# Patient Record
Sex: Female | Born: 1960 | Race: White | Hispanic: No | Marital: Married | Smoking: Never smoker
Health system: Southern US, Community
[De-identification: ages and names within clinical notes are randomized; demographics above are authoritative.]

## PROBLEM LIST (undated history)

## (undated) DIAGNOSIS — J189 Pneumonia, unspecified organism: Secondary | ICD-10-CM

## (undated) DIAGNOSIS — D649 Anemia, unspecified: Secondary | ICD-10-CM

## (undated) DIAGNOSIS — N840 Polyp of corpus uteri: Secondary | ICD-10-CM

## (undated) DIAGNOSIS — K219 Gastro-esophageal reflux disease without esophagitis: Secondary | ICD-10-CM

## (undated) HISTORY — PX: COLONOSCOPY: SHX174

## (undated) HISTORY — PX: UPPER GI ENDOSCOPY: SHX6162

---

## 1998-08-21 ENCOUNTER — Other Ambulatory Visit: Admission: RE | Admit: 1998-08-21 | Discharge: 1998-08-21 | Payer: Self-pay | Admitting: *Deleted

## 1999-09-02 ENCOUNTER — Other Ambulatory Visit: Admission: RE | Admit: 1999-09-02 | Discharge: 1999-09-02 | Payer: Self-pay | Admitting: *Deleted

## 2000-09-02 ENCOUNTER — Other Ambulatory Visit: Admission: RE | Admit: 2000-09-02 | Discharge: 2000-09-02 | Payer: Self-pay | Admitting: *Deleted

## 2001-09-07 ENCOUNTER — Other Ambulatory Visit: Admission: RE | Admit: 2001-09-07 | Discharge: 2001-09-07 | Payer: Self-pay | Admitting: *Deleted

## 2004-02-11 ENCOUNTER — Other Ambulatory Visit: Admission: RE | Admit: 2004-02-11 | Discharge: 2004-02-11 | Payer: Self-pay | Admitting: Obstetrics and Gynecology

## 2005-02-19 ENCOUNTER — Ambulatory Visit: Payer: Self-pay | Admitting: Internal Medicine

## 2005-12-17 ENCOUNTER — Ambulatory Visit: Payer: Self-pay | Admitting: Gastroenterology

## 2005-12-21 ENCOUNTER — Ambulatory Visit: Payer: Self-pay | Admitting: Gastroenterology

## 2006-05-01 IMAGING — NM NUCLEAR MEDICINE HEPATOHBILIARY INCLUDE GB
2 series · 12 of 12 positions shown · non-contrast
Comparison: none

REASON FOR EXAM: RUQ Pain
COMMENTS:

[Series 1: gallbladder dynamic · 4.80mm/px · 6 of 60 frames shown]
[frame 6/60]
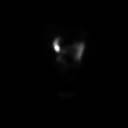
[frame 16/60]
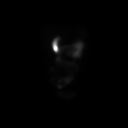
[frame 26/60]
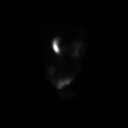
[frame 36/60]
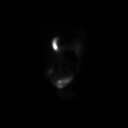
[frame 46/60]
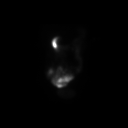
[frame 56/60]
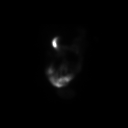

[Series 1: gallbladder dynamic (results) · 4.80mm/px · 6 of 60 frames shown]
[frame 6/60]
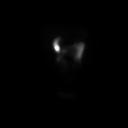
[frame 16/60]
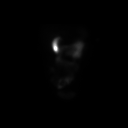
[frame 26/60]
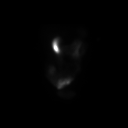
[frame 36/60]
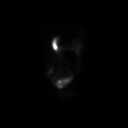
[frame 46/60]
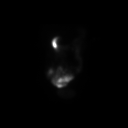
[frame 56/60]
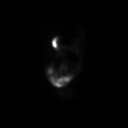

[12 of 12 positions shown; findings below may reference images not displayed]

PROCEDURE:     NM  - NM HEPATO WITH GB EJECT FRACTION  - February 19, 2005 [DATE]

RESULT:     The patient received 8.4 mCi of Technetium 99m labeled Choletec.
 The patient also received 1.2 micrograms of Sincalide intravenously over 30
minutes.  There is adequate uptake of the radiopharmaceutical by the liver.
Activity is demonstrated within the common bile duct and gallbladder by 20
minutes.  The 30 minute gallbladder ejection fraction is normal at 53%.
IMPRESSION: Normal gallbladder ejection fraction.

## 2007-02-26 IMAGING — RF DG BARIUM SWALLOW
1 series · 13 of 13 positions shown · non-contrast
Comparison: none

REASON FOR EXAM: With tablet.  Dysphagia
COMMENTS:

[Series 1: run · 11 acquisitions, 13 frames shown]
[im 1/11]
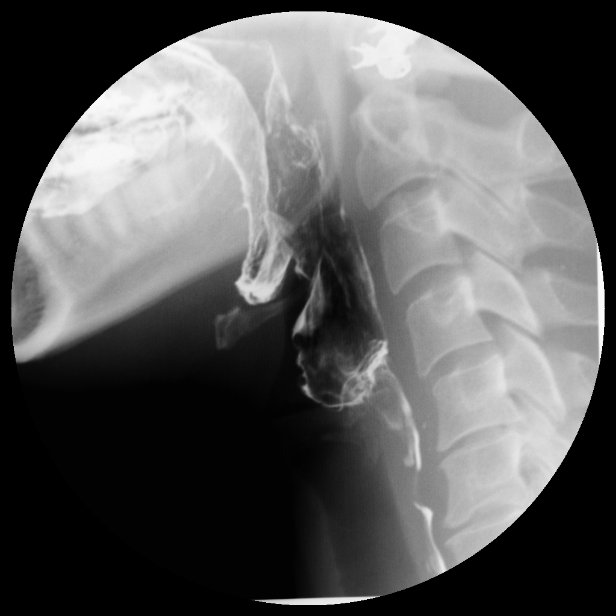
[im 1/11]
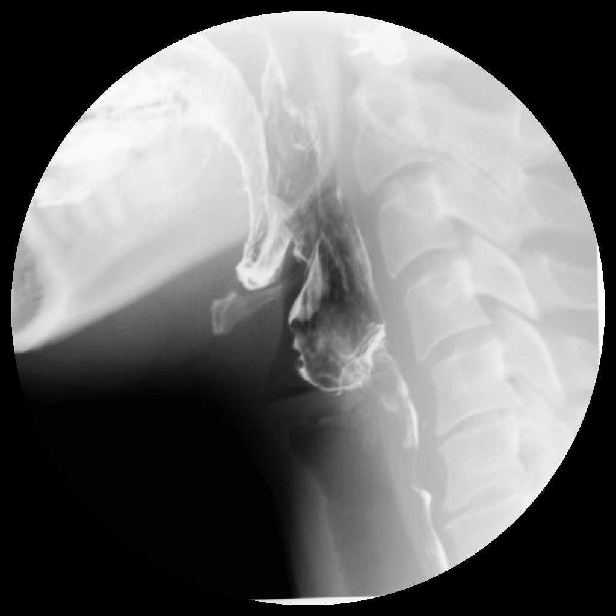
[im 2/11]
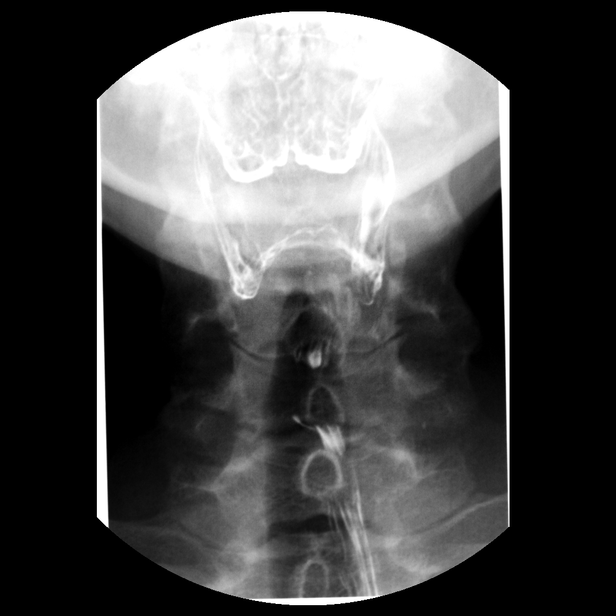
[im 2/11]
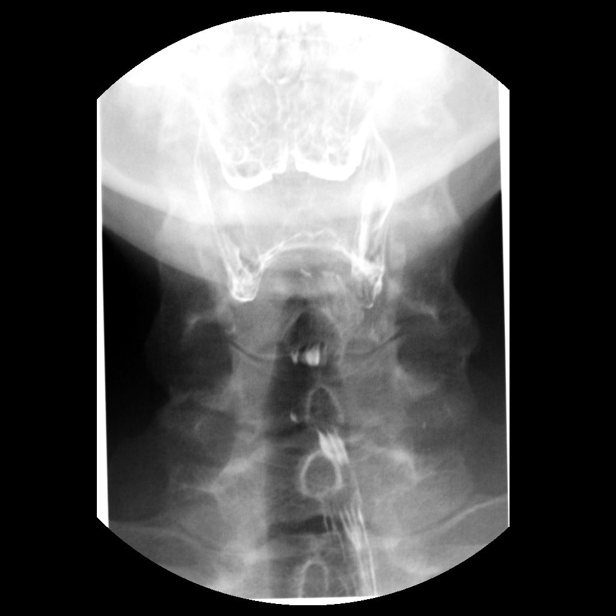
[im 3/11]
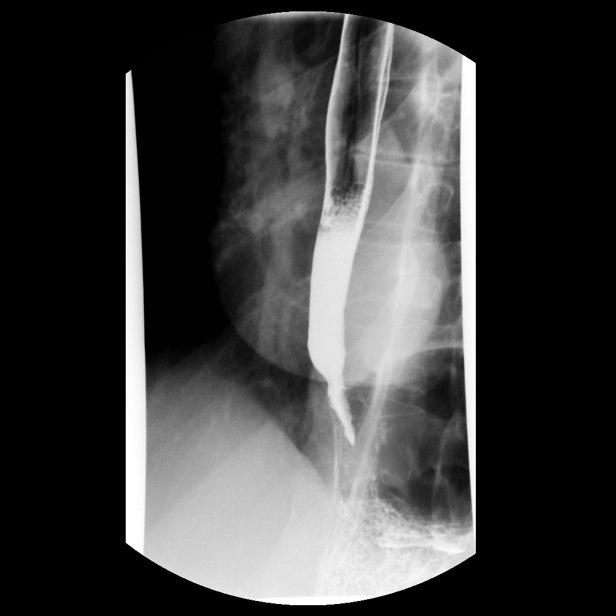
[im 4/11]
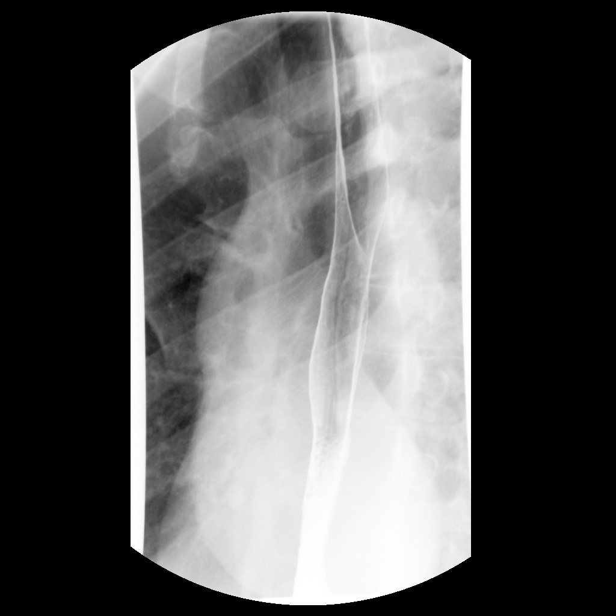
[im 5/11]
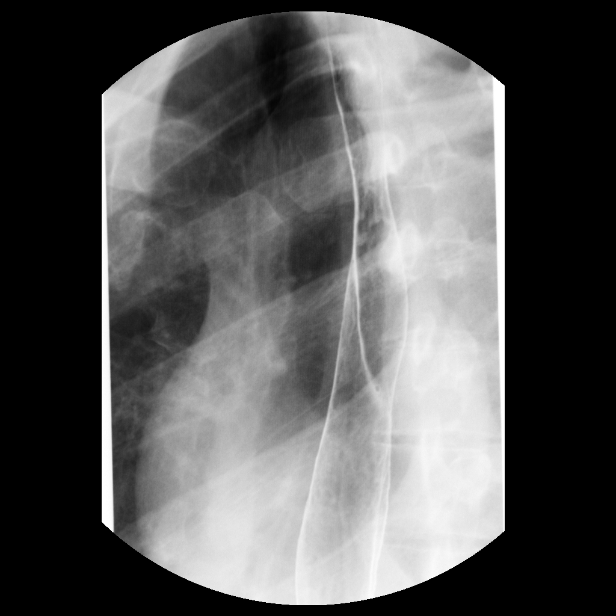
[im 6/11]
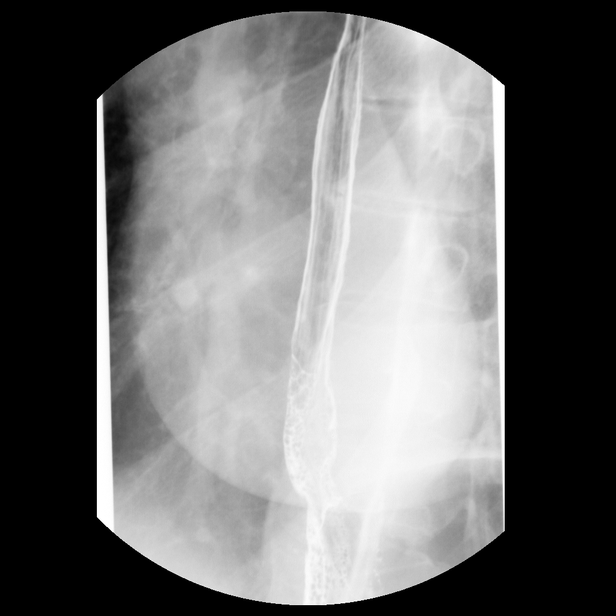
[im 7/11]
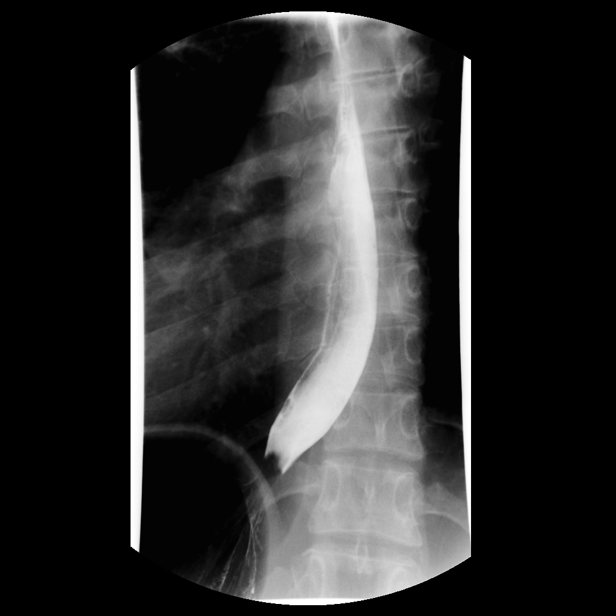
[im 8/11]
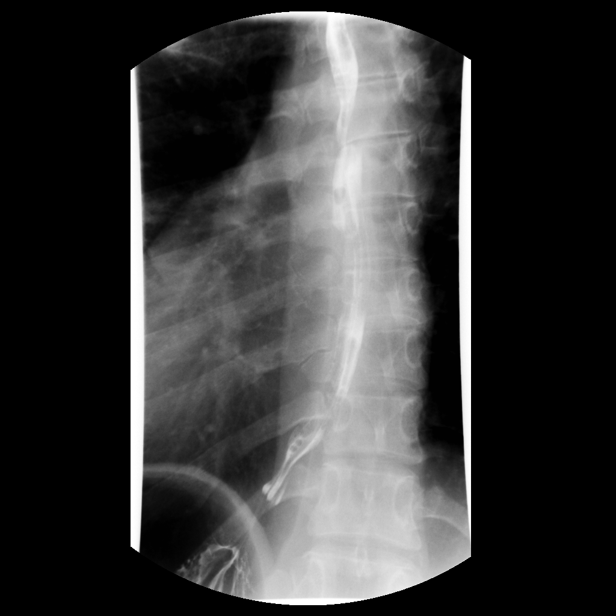
[im 9/11]
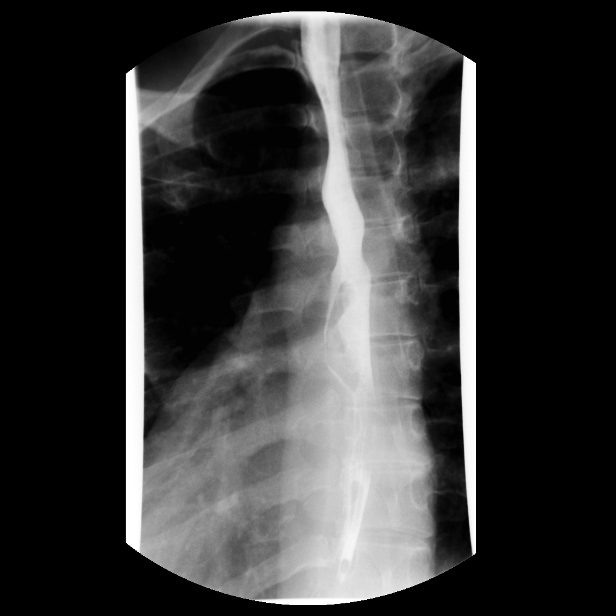
[im 10/11]
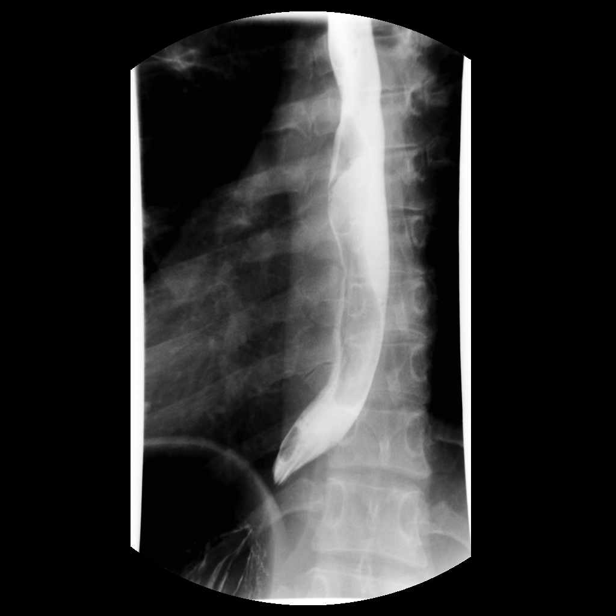
[im 11/11]
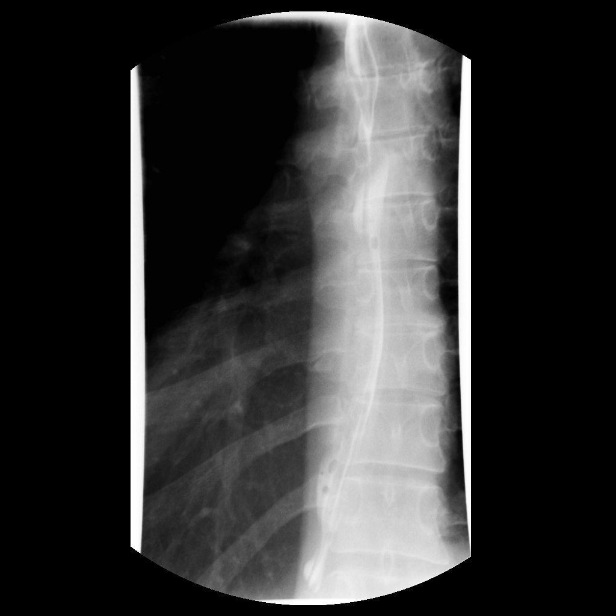

[13 of 13 positions shown; findings below may reference images not displayed]

PROCEDURE:     FL  - FL BARIUM SWALLOW  - December 17, 2005  [DATE]

RESULT:     The patient is complaining of dysphagia.

The anticipated procedure was discussed with Mrs. Crusito.  The patient voiced
her willingness to proceed.

The cervical esophagus distended well.  There is no laryngeal penetration of
the barium.  The thoracic esophagus also distended well.  A tiny reducible
hiatal hernia was seen.  There is a trace of gastroesophageal reflux.  The
12-mm barium pill passed without difficulty.  There is no evidence of an
ulceration or esophagitis.
IMPRESSION: I do not see acute abnormality of the esophagus.  A tiny amount of reflux
with a tiny hiatal hernia was seen.  The barium pill passed without
difficulty.

Given the patient's symptoms, the planned upper endoscopy would likely be
useful with attention to the oropharynx and hypopharynx.

Incidental note is made of disk space narrowing at C5-6 and to a lesser
extent at C6-7.

## 2007-04-24 ENCOUNTER — Emergency Department: Payer: Self-pay | Admitting: Emergency Medicine

## 2011-09-25 ENCOUNTER — Ambulatory Visit: Payer: Self-pay | Admitting: Otolaryngology

## 2011-09-25 LAB — CBC WITH DIFFERENTIAL/PLATELET
Basophil #: 0 10*3/uL (ref 0.0–0.1)
Eosinophil #: 0 10*3/uL (ref 0.0–0.7)
HGB: 11.5 g/dL — ABNORMAL LOW (ref 12.0–16.0)
Lymphocyte %: 26.1 %
MCHC: 32.5 g/dL (ref 32.0–36.0)
Monocyte #: 0.6 10*3/uL (ref 0.0–0.7)
Monocyte %: 8.3 %
Neutrophil #: 4.6 10*3/uL (ref 1.4–6.5)
Neutrophil %: 64.4 %
RBC: 4.08 10*6/uL (ref 3.80–5.20)
WBC: 7 10*3/uL (ref 3.6–11.0)

## 2013-10-06 ENCOUNTER — Ambulatory Visit: Payer: Self-pay | Admitting: Gastroenterology

## 2013-11-16 ENCOUNTER — Encounter (HOSPITAL_COMMUNITY): Payer: Self-pay

## 2013-11-21 ENCOUNTER — Other Ambulatory Visit: Payer: Self-pay | Admitting: Obstetrics & Gynecology

## 2013-11-30 ENCOUNTER — Encounter (HOSPITAL_COMMUNITY): Payer: Self-pay | Admitting: Pharmacist

## 2013-12-01 ENCOUNTER — Ambulatory Visit (HOSPITAL_COMMUNITY)
Admission: RE | Admit: 2013-12-01 | Discharge: 2013-12-01 | Disposition: A | Discharge status: Home / Self Care | Payer: BC Managed Care – PPO | Source: Ambulatory Visit | Attending: Obstetrics & Gynecology | Admitting: Obstetrics & Gynecology

## 2013-12-01 ENCOUNTER — Encounter (HOSPITAL_COMMUNITY): Payer: BC Managed Care – PPO | Admitting: Anesthesiology

## 2013-12-01 ENCOUNTER — Ambulatory Visit (HOSPITAL_COMMUNITY): Payer: BC Managed Care – PPO | Admitting: Anesthesiology

## 2013-12-01 ENCOUNTER — Encounter (HOSPITAL_COMMUNITY): Payer: Self-pay | Admitting: *Deleted

## 2013-12-01 ENCOUNTER — Encounter (HOSPITAL_COMMUNITY)
Admission: RE | Disposition: A | Discharge status: Home / Self Care | Payer: Self-pay | Source: Ambulatory Visit | Attending: Obstetrics & Gynecology

## 2013-12-01 DIAGNOSIS — N920 Excessive and frequent menstruation with regular cycle: Secondary | ICD-10-CM | POA: Insufficient documentation

## 2013-12-01 DIAGNOSIS — N840 Polyp of corpus uteri: Secondary | ICD-10-CM | POA: Insufficient documentation

## 2013-12-01 DIAGNOSIS — K219 Gastro-esophageal reflux disease without esophagitis: Secondary | ICD-10-CM | POA: Insufficient documentation

## 2013-12-01 HISTORY — DX: Polyp of corpus uteri: N84.0

## 2013-12-01 HISTORY — DX: Gastro-esophageal reflux disease without esophagitis: K21.9

## 2013-12-01 HISTORY — PX: DILATATION & CURETTAGE/HYSTEROSCOPY WITH TRUECLEAR: SHX6353

## 2013-12-01 HISTORY — DX: Anemia, unspecified: D64.9

## 2013-12-01 LAB — CBC
HCT: 36.2 % (ref 36.0–46.0)
Hemoglobin: 12.1 g/dL (ref 12.0–15.0)
MCH: 28.8 pg (ref 26.0–34.0)
MCHC: 33.4 g/dL (ref 30.0–36.0)
MCV: 86.2 fL (ref 78.0–100.0)
PLATELETS: 220 10*3/uL (ref 150–400)
RBC: 4.2 MIL/uL (ref 3.87–5.11)
RDW: 13.7 % (ref 11.5–15.5)
WBC: 6.9 10*3/uL (ref 4.0–10.5)

## 2013-12-01 SURGERY — DILATATION & CURETTAGE/HYSTEROSCOPY WITH TRUCLEAR
Anesthesia: General | Site: Vagina

## 2013-12-01 MED ORDER — MIDAZOLAM HCL 2 MG/2ML IJ SOLN
INTRAMUSCULAR | Status: AC
  Filled 2013-12-01: qty 2

## 2013-12-01 MED ORDER — FENTANYL CITRATE 0.05 MG/ML IJ SOLN
INTRAMUSCULAR | Status: DC | PRN
  Administered 2013-12-01 (×2): 50 ug via INTRAVENOUS

## 2013-12-01 MED ORDER — KETOROLAC TROMETHAMINE 30 MG/ML IJ SOLN: 15.0000 mg | Freq: Once | INTRAMUSCULAR | Status: DC | PRN

## 2013-12-01 MED ORDER — DEXAMETHASONE SODIUM PHOSPHATE 10 MG/ML IJ SOLN
INTRAMUSCULAR | Status: AC
  Filled 2013-12-01: qty 1

## 2013-12-01 MED ORDER — FENTANYL CITRATE 0.05 MG/ML IJ SOLN
INTRAMUSCULAR | Status: AC
  Filled 2013-12-01: qty 2

## 2013-12-01 MED ORDER — MIDAZOLAM HCL 2 MG/2ML IJ SOLN
INTRAMUSCULAR | Status: DC | PRN
  Administered 2013-12-01: 2 mg via INTRAVENOUS

## 2013-12-01 MED ORDER — LIDOCAINE HCL (CARDIAC) 20 MG/ML IV SOLN
INTRAVENOUS | Status: DC | PRN
  Administered 2013-12-01: 30 mg via INTRAVENOUS

## 2013-12-01 MED ORDER — FENTANYL CITRATE 0.05 MG/ML IJ SOLN: 25.0000 ug | INTRAMUSCULAR | Status: DC | PRN

## 2013-12-01 MED ORDER — CEFAZOLIN SODIUM-DEXTROSE 2-3 GM-% IV SOLR
INTRAVENOUS | Status: AC
  Filled 2013-12-01: qty 50

## 2013-12-01 MED ORDER — KETOROLAC TROMETHAMINE 30 MG/ML IJ SOLN
INTRAMUSCULAR | Status: AC
  Filled 2013-12-01: qty 1

## 2013-12-01 MED ORDER — IBUPROFEN 600 MG PO TABS: 600.0000 mg | ORAL_TABLET | Freq: Three times a day (TID) | ORAL | Status: DC | PRN

## 2013-12-01 MED ORDER — KETOROLAC TROMETHAMINE 30 MG/ML IJ SOLN
INTRAMUSCULAR | Status: DC | PRN
  Administered 2013-12-01: 30 mg via INTRAVENOUS

## 2013-12-01 MED ORDER — LACTATED RINGERS IV SOLN
INTRAVENOUS | Status: DC
  Administered 2013-12-01 (×2): via INTRAVENOUS

## 2013-12-01 MED ORDER — METOCLOPRAMIDE HCL 5 MG/ML IJ SOLN: 10.0000 mg | Freq: Once | INTRAMUSCULAR | Status: DC | PRN

## 2013-12-01 MED ORDER — ONDANSETRON HCL 4 MG/2ML IJ SOLN
INTRAMUSCULAR | Status: DC | PRN
  Administered 2013-12-01: 4 mg via INTRAVENOUS

## 2013-12-01 MED ORDER — CEFAZOLIN SODIUM-DEXTROSE 2-3 GM-% IV SOLR
2.0000 g | INTRAVENOUS | Status: AC
  Administered 2013-12-01: 2 g via INTRAVENOUS

## 2013-12-01 MED ORDER — ONDANSETRON HCL 4 MG/2ML IJ SOLN
INTRAMUSCULAR | Status: AC
  Filled 2013-12-01: qty 2

## 2013-12-01 MED ORDER — LIDOCAINE HCL 1 % IJ SOLN
INTRAMUSCULAR | Status: AC
  Filled 2013-12-01: qty 20

## 2013-12-01 MED ORDER — DEXAMETHASONE SODIUM PHOSPHATE 10 MG/ML IJ SOLN
INTRAMUSCULAR | Status: DC | PRN
  Administered 2013-12-01 (×2): 5 mg via INTRAVENOUS

## 2013-12-01 MED ORDER — LIDOCAINE HCL (CARDIAC) 20 MG/ML IV SOLN
INTRAVENOUS | Status: AC
  Filled 2013-12-01: qty 5

## 2013-12-01 MED ORDER — PROPOFOL 10 MG/ML IV EMUL
INTRAVENOUS | Status: AC
  Filled 2013-12-01: qty 20

## 2013-12-01 MED ORDER — LIDOCAINE HCL 1 % IJ SOLN
INTRAMUSCULAR | Status: DC | PRN
  Administered 2013-12-01: 10 mL

## 2013-12-01 MED ORDER — PROPOFOL 10 MG/ML IV BOLUS
INTRAVENOUS | Status: DC | PRN
  Administered 2013-12-01: 150 mg via INTRAVENOUS
  Administered 2013-12-01: 20 mg via INTRAVENOUS

## 2013-12-01 MED ORDER — MEPERIDINE HCL 25 MG/ML IJ SOLN: 6.2500 mg | INTRAMUSCULAR | Status: DC | PRN

## 2013-12-01 MED ORDER — SODIUM CHLORIDE 0.9 % IR SOLN
Status: DC | PRN
  Administered 2013-12-01: 1

## 2013-12-01 SURGICAL SUPPLY — 21 items
BLADE INCISOR TRUC PLUS 2.9 (ABLATOR) ×1 IMPLANT
CANISTER SUCT 3000ML (MISCELLANEOUS) ×3 IMPLANT
CANISTERS HI-FLOW 3000CC (CANNISTER) IMPLANT
CATH ROBINSON RED A/P 16FR (CATHETERS) ×3 IMPLANT
CLOTH BEACON ORANGE TIMEOUT ST (SAFETY) ×3 IMPLANT
CONTAINER PREFILL 10% NBF 60ML (FORM) ×6 IMPLANT
DRAPE HYSTEROSCOPY (DRAPE) ×3 IMPLANT
DRSG TELFA 3X8 NADH (GAUZE/BANDAGES/DRESSINGS) ×3 IMPLANT
GLOVE BIO SURGEON STRL SZ7 (GLOVE) ×3 IMPLANT
GLOVE BIOGEL PI IND STRL 7.0 (GLOVE) ×1 IMPLANT
GLOVE BIOGEL PI INDICATOR 7.0 (GLOVE) ×2
GOWN STRL REUS W/TWL LRG LVL3 (GOWN DISPOSABLE) ×6 IMPLANT
INCISOR TRUC PLUS BLADE 2.9 (ABLATOR) ×3
KIT HYSTEROSCOPY TRUCLEAR (ABLATOR) IMPLANT
MORCELLATOR RECIP TRUCLEAR 4.0 (ABLATOR) IMPLANT
NEEDLE SPNL 22GX3.5 QUINCKE BK (NEEDLE) ×3 IMPLANT
PACK VAGINAL MINOR WOMEN LF (CUSTOM PROCEDURE TRAY) ×3 IMPLANT
PAD OB MATERNITY 4.3X12.25 (PERSONAL CARE ITEMS) ×3 IMPLANT
SYR CONTROL 10ML LL (SYRINGE) ×3 IMPLANT
TOWEL OR 17X24 6PK STRL BLUE (TOWEL DISPOSABLE) ×6 IMPLANT
WATER STERILE IRR 1000ML POUR (IV SOLUTION) ×3 IMPLANT

## 2013-12-01 NOTE — Transfer of Care (Signed)
Immediate Anesthesia Transfer of Care Note  Patient: Jovita KussmaulSuzanne M Haston  Procedure(s) Performed: Procedure(s): DILATATION & CURETTAGE/HYSTEROSCOPY WITH TRUCLEAR (N/A)  Patient Location: PACU  Anesthesia Type:General  Level of Consciousness: sedated  Airway & Oxygen Therapy: Patient Spontanous Breathing and Patient connected to nasal cannula oxygen  Post-op Assessment: Report given to PACU RN and Post -op Vital signs reviewed and stable  Post vital signs: stable  Complications: No apparent anesthesia complications

## 2013-12-01 NOTE — Discharge Instructions (Addendum)
No ibuprofen containing products (ie Advil, Aleve, Motrin,etc.) until after 9 pm tonight.  Hysteroscopy, Care After Refer to this sheet in the next few weeks. These instructions provide you with information on caring for yourself after your procedure. Your health care provider may also give you more specific instructions. Your treatment has been planned according to current medical practices, but problems sometimes occur. Call your health care provider if you have any problems or questions after your procedure.  WHAT TO EXPECT AFTER THE PROCEDURE After your procedure, it is typical to have the following:  You may have some cramping. This normally lasts for a couple days.  You may have bleeding. This can vary from light spotting for a few days to menstrual-like bleeding for 3 7 days. HOME CARE INSTRUCTIONS  Rest for the first 1 2 days after the procedure.  Only take over-the-counter or prescription medicines as directed by your health care provider. Do not take aspirin. It can increase the chances of bleeding.  Take showers instead of baths for 2 weeks or as directed by your health care provider.  Do not drive for 24 hours or as directed.  Do not drink alcohol while taking pain medicine.  Do not use tampons, douche, or have sexual intercourse for 2 weeks or until your health care provider says it is okay.  Take your temperature twice a day for 4 5 days. Write it down each time.  Follow your health care provider's advice about diet, exercise, and lifting.  If you develop constipation, you may:  Take a mild laxative if your health care provider approves.  Add bran foods to your diet.  Drink enough fluids to keep your urine clear or pale yellow.  Try to have someone with you or available to you for the first 24 48 hours, especially if you were given a general anesthetic.  Follow up with your health care provider as directed. SEEK MEDICAL CARE IF:  You feel dizzy or  lightheaded.  You feel sick to your stomach (nauseous).  You have abnormal vaginal discharge.  You have a rash.  You have pain that is not controlled with medicine. SEEK IMMEDIATE MEDICAL CARE IF:  You have bleeding that is heavier than a normal menstrual period.  You have a fever.  You have increasing cramps or pain, not controlled with medicine.  You have new belly (abdominal) pain.  You pass out.  You have pain in the tops of your shoulders (shoulder strap areas).  You have shortness of breath. Document Released: 06/28/2013 Document Reviewed: 04/06/2013 [facility] Patient Information 2014 [employer], [place].

## 2013-12-01 NOTE — H&P (Signed)
Robin Goodwin is an 53 y.o. female.Perimenopausal with abnormal menses, menometrorrhagia, office sono noted endometrial polyp, here for surgery.  Normal Paps, normal mammograms.    Patient's last menstrual period was 11/16/2013.   Past Medical History  Diagnosis Date  . GERD (gastroesophageal reflux disease)   . Anemia    Past Surgical History  Procedure Laterality Date  . Colonoscopy    . Upper gi endoscopy     History reviewed. No pertinent family history.  Social History:  reports that she has never smoked. She has never used smokeless tobacco. She reports that she does not drink alcohol or use illicit drugs.  Allergies:  Allergies  Allergen Reactions  . Duricef [Cefadroxil] Other (See Comments)    Blood in stool  . Sulfa Antibiotics Other (See Comments)    Eye pain, visual changes   ROS neg  Height 5\' 4"  (1.626 m), weight 125 lb (56.7 kg), last menstrual period 11/16/2013. Physical Exam A&O x 3, no acute distress. Pleasant HEENT neg, no thyromegaly Lungs CTA bilat CV RRR, S1S2 normal Abdo soft, non tender, non acute Extr no edema/ tenderness Pelvic - deferred. But office exam, normal cervix and uterus and adnexa.   No results found for this or any previous visit (from the past 24 hour(s)).  No results found.  Assessment/Plan: 53 yo, abnormal bleeding, office sono noted endometrial polyp, pt here for hysteroscopic polypectomy and D&C.  Risks/complications of surgery reviewed incl infection, bleeding, damage to internal organs including bladder, bowels, ureters, blood vessels, other risks from anesthesia, VTE and delayed complications of any surgery, complications in future surgery reviewed.   Ross Bender R 12/01/2013, 12:58 PM

## 2013-12-01 NOTE — Anesthesia Postprocedure Evaluation (Signed)
  Anesthesia Post-op Note  Anesthesia Post Note  Patient: Robin Goodwin  Procedure(s) Performed: Procedure(s) (LRB): DILATATION & CURETTAGE/HYSTEROSCOPY WITH TRUCLEAR (N/A)  Anesthesia type: General  Patient location: PACU  Post pain: Pain level controlled  Post assessment: Post-op Vital signs reviewed  Last Vitals:  Filed Vitals:   12/01/13 1615  BP: 136/77  Pulse: 72  Temp: 36.5 C  Resp: 14    Post vital signs: Reviewed  Level of consciousness: sedated  Complications: No apparent anesthesia complications

## 2013-12-01 NOTE — Op Note (Signed)
Preoperative diagnosis: Menometrorrhagia, endometrial polyp  Postop diagnosis: as above.  Procedure: Hysteroscopic polypectomy using Trueclear device, D&C Anesthesia General via LMA  Surgeon: Shea EvansVaishali Chestina Komatsu, MD  Assistant: none IV fluids : 1200 cc LR Estimated blood loss 20 cc   Hysteroscopic fluid deficit 50 cc saline Complications none  Condition stable  Disposition PACU  Specimen: endometrial polyp with endometrial curettings   Procedure  Indication: Menometrorhagia. Office sono noted a large endometrial polyp. Patient was counseled on risks/ complications including infection, bleeding, damage to internal organs, she understood and agrees, gave informed written consent.  Patient was brought to the operating room with IV running. Time out was carried out. She received preop 1 gm Ancef. She underwent general anesthesia via LMA without complications. She was given dorsolithotomy position. Parts were prepped and draped in standard fashion. Bimanual exam revealed uterus to be anteverted and normal size. Speculum was placed and cervix was grasped with single-tooth tenaculum. Cervical block with 10 cc 1% plain Xylocaine given. The uterus was sounded to 8 cm. Cervical os was dilated to 17 [place] dilator. Trueclear Hysteroscope was introduced in the uterine cavity under vision, using normal saline for irrigation.  Findings:  Normal fundus and tubal ostii. Endometrial polyp noted from lower segment and going up to the fundus. Endometrial cavity otherwise normal with thin uniform endometrial tissue.   Hysteroscopic polypectomy was performed with Trueclear polyp blade and then gentle curettage performed with polyp blade. Instruments removed and sharp curettage performed with a curette. Specimen sent to path.   Fluid deficit 50 cc saline.    All counts are correct x2. No complications. Patient was made supine dorsal anesthesia and brought to the recovery room in stable condition.  Patient will be  discharged home today. Discharge meds Ibuprofen as needed. Follow up in 2 weeks in office. Warning signs of infection and excessive bleeding reviewed.   V.Teagan Heidrick, MD.

## 2013-12-01 NOTE — Anesthesia Preprocedure Evaluation (Addendum)
Anesthesia Evaluation  Patient identified by MRN, date of birth, ID band Patient awake    Reviewed: Allergy & Precautions, H&P , NPO status , Patient's Chart, lab work & pertinent test results, reviewed documented beta blocker date and time   History of Anesthesia Complications Negative for: history of anesthetic complications  Airway Mallampati: I TM Distance: >3 FB Neck ROM: full    Dental  (+) Teeth Intact   Pulmonary neg pulmonary ROS,  breath sounds clear to auscultation  Pulmonary exam normal       Cardiovascular Exercise Tolerance: Good negative cardio ROS  Rhythm:regular Rate:Normal     Neuro/Psych negative neurological ROS  negative psych ROS   GI/Hepatic Neg liver ROS, GERD- (rare tums)  ,  Endo/Other  negative endocrine ROS  Renal/GU negative Renal ROS  Female GU complaint Hives - hormonal    Musculoskeletal   Abdominal   Peds  Hematology negative hematology ROS (+)   Anesthesia Other Findings   Reproductive/Obstetrics negative OB ROS                          Anesthesia Physical Anesthesia Plan  ASA: I  Anesthesia Plan: General LMA   Post-op Pain Management:    Induction:   Airway Management Planned:   Additional Equipment:   Intra-op Plan:   Post-operative Plan:   Informed Consent: I have reviewed the patients History and Physical, chart, labs and discussed the procedure including the risks, benefits and alternatives for the proposed anesthesia with the patient or authorized representative who has indicated his/her understanding and acceptance.   Dental Advisory Given  Plan Discussed with: CRNA and Surgeon  Anesthesia Plan Comments:        Anesthesia Quick Evaluation

## 2013-12-04 ENCOUNTER — Encounter (HOSPITAL_COMMUNITY): Payer: Self-pay | Admitting: Obstetrics & Gynecology

## 2013-12-11 NOTE — Addendum Note (Signed)
Addendum created 12/11/13 2312 by Dana AllanAmy Fujie Dickison, MD   Modules edited: Anesthesia Attestations

## 2014-01-26 ENCOUNTER — Encounter: Payer: Self-pay | Admitting: Podiatry

## 2014-01-26 ENCOUNTER — Ambulatory Visit (INDEPENDENT_AMBULATORY_CARE_PROVIDER_SITE_OTHER): Payer: BC Managed Care – PPO | Admitting: Podiatry

## 2014-01-26 VITALS — BP 112/64 | HR 75 | Resp 16 | Ht 64.0 in | Wt 125.0 lb

## 2014-01-26 DIAGNOSIS — L600 Ingrowing nail: Secondary | ICD-10-CM

## 2014-01-26 NOTE — Progress Notes (Signed)
   Subjective:    Patient ID: Robin KussmaulSuzanne M Goodwin, female    DOB: 10/06/1960, 53 y.o.   MRN: [id number]  HPI Comments: The left great toenail is coming off, do not want to have it removed til Monday my daughter is graduating nursing school this weekend and i dont want to have to worry a bout  It.      Review of Systems  All other systems reviewed and are negative.      Objective:   Physical Exam        Assessment & Plan:

## 2014-01-28 NOTE — Progress Notes (Signed)
Subjective:     Patient ID: Robin Goodwin, female   DOB: 12/26/1960, 53 y.o.   MRN: [id number]  HPI patient states I stubbed my left big toe Wednesday in its loose and I am worried I'm going to loose it.   Review of Systems  All other systems reviewed and are negative.      Objective:   Physical Exam  Nursing note and vitals reviewed. Constitutional: She is oriented to person, place, and time.  Cardiovascular: Intact distal pulses.   Musculoskeletal: Normal range of motion.  Neurological: She is oriented to person, place, and time.  Skin: Skin is warm.   neurovascular status intact with patient having normal muscle strength and range of motion subtalar midtarsal joint. Digits are well perfused arch height is normal and patient is found to have a very loose left big toenail that is still secured on the lateral proximal side     Assessment:     Damage hallux nail left that's loose from trauma    Plan:     H&P performed and reviewed condition. We're going to hold off with removal and less it should become painful or start to drain where it will need to be taken off

## 2014-11-13 ENCOUNTER — Ambulatory Visit: Payer: Self-pay | Admitting: Gastroenterology

## 2014-12-27 ENCOUNTER — Ambulatory Visit
Admit: 2014-12-27 | Disposition: A | Discharge status: Home / Self Care | Payer: Self-pay | Attending: Family Medicine | Admitting: Family Medicine

## 2015-01-14 LAB — SURGICAL PATHOLOGY

## 2015-05-23 DIAGNOSIS — J189 Pneumonia, unspecified organism: Secondary | ICD-10-CM

## 2015-05-23 HISTORY — DX: Pneumonia, unspecified organism: J18.9

## 2016-03-14 ENCOUNTER — Encounter (HOSPITAL_COMMUNITY): Payer: Self-pay | Admitting: *Deleted

## 2016-03-14 ENCOUNTER — Inpatient Hospital Stay (HOSPITAL_COMMUNITY)
Admission: AD | Admit: 2016-03-14 | Discharge: 2016-03-14 | Disposition: A | Discharge status: Home / Self Care | Payer: BLUE CROSS/BLUE SHIELD | Source: Ambulatory Visit | Attending: Obstetrics and Gynecology | Admitting: Obstetrics and Gynecology

## 2016-03-14 DIAGNOSIS — Z3202 Encounter for pregnancy test, result negative: Secondary | ICD-10-CM | POA: Diagnosis not present

## 2016-03-14 DIAGNOSIS — N939 Abnormal uterine and vaginal bleeding, unspecified: Secondary | ICD-10-CM | POA: Insufficient documentation

## 2016-03-14 DIAGNOSIS — D649 Anemia, unspecified: Secondary | ICD-10-CM | POA: Diagnosis not present

## 2016-03-14 DIAGNOSIS — K219 Gastro-esophageal reflux disease without esophagitis: Secondary | ICD-10-CM | POA: Insufficient documentation

## 2016-03-14 LAB — URINALYSIS, ROUTINE W REFLEX MICROSCOPIC
Bilirubin Urine: NEGATIVE
Glucose, UA: NEGATIVE mg/dL
Ketones, ur: NEGATIVE mg/dL
LEUKOCYTES UA: NEGATIVE
NITRITE: NEGATIVE
Protein, ur: NEGATIVE mg/dL
pH: 5.5 (ref 5.0–8.0)

## 2016-03-14 LAB — CBC
HEMATOCRIT: 34.6 % — AB (ref 36.0–46.0)
Hemoglobin: 11.7 g/dL — ABNORMAL LOW (ref 12.0–15.0)
MCH: 29.8 pg (ref 26.0–34.0)
MCHC: 33.8 g/dL (ref 30.0–36.0)
MCV: 88.3 fL (ref 78.0–100.0)
Platelets: 223 10*3/uL (ref 150–400)
RBC: 3.92 MIL/uL (ref 3.87–5.11)
RDW: 13.2 % (ref 11.5–15.5)
WBC: 7.5 10*3/uL (ref 4.0–10.5)

## 2016-03-14 LAB — POCT PREGNANCY, URINE: Preg Test, Ur: NEGATIVE

## 2016-03-14 LAB — URINE MICROSCOPIC-ADD ON: BACTERIA UA: NONE SEEN

## 2016-03-14 NOTE — MAU Note (Signed)
Pt states she has been having heavy vaginal bleeding for several days.  Pt states she has always had heavy periods and then recently they have gotten further and further apart.  This one is heavier than a normal one.  Pt states she has been unable to do much for the past three days.  Pt states if she gets up and walks she saturates a pad.  Pt states the last pad she put on before coming here was not as bad as it has been but she said she had a lot of blood running out in the toilet.

## 2016-03-14 NOTE — Discharge Instructions (Signed)

## 2016-03-14 NOTE — MAU Provider Note (Signed)
Chief Complaint: Vaginal Bleeding   None     SUBJECTIVE HPI: Robin Goodwin is a 55 y.o. G3P3003 who presents to maternity admissions reporting heavier than normal bleeding and irregular periods. She started bleeding 2 days ago and bleeding has become heavier over time.  She reports she normally has heavy periods, and in recent months her periods have become more irregular.  She has an allergy to progesterone, even her natural progesterone in pregnancy, and cannot take hormone supplements.  She does report some lightheadedness since onset of bleeding but denies fatigue, chest pain, or palpitations. She is on iron supplements for anemia. She denies abdominal pain, vaginal itching/burning, urinary symptoms, h/a, n/v, or fever/chills.     HPI  Past Medical History  Diagnosis Date  . GERD (gastroesophageal reflux disease)   . Anemia   . Vaginal delivery 1989, 1992, 1995  . Endometrial polyp 12/01/2013   Past Surgical History  Procedure Laterality Date  . Colonoscopy    . Upper gi endoscopy    . Dilatation & curettage/hysteroscopy with trueclear N/A 12/01/2013    Procedure: DILATATION & CURETTAGE/HYSTEROSCOPY WITH TRUCLEAR;  Surgeon: Robley FriesVaishali R Mody, MD;  Location: [facility];  Service: Gynecology;  Laterality: N/A;   Social History   Social History  . Marital Status: Married    Spouse Name: N/A  . Number of Children: N/A  . Years of Education: N/A   Occupational History  . Not on file.   Social History Main Topics  . Smoking status: Never Smoker   . Smokeless tobacco: Never Used  . Alcohol Use: No  . Drug Use: No  . Sexual Activity: Not on file   Other Topics Concern  . Not on file   Social History Narrative   No current facility-administered medications on file prior to encounter.   Current Outpatient Prescriptions on File Prior to Encounter  Medication Sig Dispense Refill  . montelukast (SINGULAIR) 10 MG tablet Take 10 mg by mouth at bedtime.     Allergies  Allergen  Reactions  . Duricef [Cefadroxil] Other (See Comments)    Blood in stool  . Progesterone Hives  . Sulfa Antibiotics Other (See Comments)    Eye pain, visual changes    ROS:  Review of Systems  Constitutional: Negative for fever, chills and fatigue.  Respiratory: Negative for chest tightness and shortness of breath.   Cardiovascular: Negative for chest pain.  Gastrointestinal: Negative for nausea, vomiting and abdominal pain.  Genitourinary: Positive for vaginal bleeding. Negative for dysuria, flank pain, vaginal discharge, difficulty urinating, vaginal pain and pelvic pain.  Neurological: Positive for light-headedness. Negative for dizziness and headaches.  Psychiatric/Behavioral: Negative.      I have reviewed patient's Past Medical Hx, Surgical Hx, Family Hx, Social Hx, medications and allergies.   Physical Exam  Patient Vitals for the past 24 hrs:  BP Temp Temp src Pulse Resp  03/14/16 1759 136/81 mmHg 97.5 F (36.4 C) Oral 88 16   Constitutional: Well-developed, well-nourished female in no acute distress.  Cardiovascular: normal rate Respiratory: normal effort GI: Abd soft, non-tender. Pos BS x 4 MS: Extremities nontender, no edema, normal ROM Neurologic: Alert and oriented x 4.  GU: Neg CVAT.  PELVIC EXAM: Cervix pink, visually closed, without lesion, moderate amount dark red bleeding without clots, vaginal walls and external genitalia normal Bimanual exam: Cervix 0/long/high, firm, anterior, neg CMT, uterus nontender, slightly enlarged, adnexa without tenderness, enlargement, or mass   LAB RESULTS Results for orders placed or performed during  the hospital encounter of 03/14/16 (from the past 24 hour(s))  Urinalysis, Routine w reflex microscopic (not [facility])     Status: Abnormal   Collection Time: 03/14/16  5:50 PM  Result Value Ref Range   Color, Urine RED (A) YELLOW   APPearance CLEAR CLEAR   Specific Gravity, Urine <1.005 (L) 1.005 - 1.030   pH 5.5 5.0 - 8.0    Glucose, UA NEGATIVE NEGATIVE mg/dL   Hgb urine dipstick LARGE (A) NEGATIVE   Bilirubin Urine NEGATIVE NEGATIVE   Ketones, ur NEGATIVE NEGATIVE mg/dL   Protein, ur NEGATIVE NEGATIVE mg/dL   Nitrite NEGATIVE NEGATIVE   Leukocytes, UA NEGATIVE NEGATIVE  Urine microscopic-add on     Status: Abnormal   Collection Time: 03/14/16  5:50 PM  Result Value Ref Range   Squamous Epithelial / LPF 0-5 (A) NONE SEEN   WBC, UA 0-5 0 - 5 WBC/hpf   RBC / HPF 6-30 0 - 5 RBC/hpf   Bacteria, UA NONE SEEN NONE SEEN  Pregnancy, urine POC     Status: None   Collection Time: 03/14/16  5:55 PM  Result Value Ref Range   Preg Test, Ur NEGATIVE NEGATIVE  CBC     Status: Abnormal   Collection Time: 03/14/16  6:20 PM  Result Value Ref Range   WBC 7.5 4.0 - 10.5 K/uL   RBC 3.92 3.87 - 5.11 MIL/uL   Hemoglobin 11.7 (L) 12.0 - 15.0 g/dL   HCT 40.934.6 (L) 81.136.0 - 91.446.0 %   MCV 88.3 78.0 - 100.0 fL   MCH 29.8 26.0 - 34.0 pg   MCHC 33.8 30.0 - 36.0 g/dL   RDW 78.213.2 95.611.5 - 21.315.5 %   Platelets 223 150 - 400 K/uL       IMAGING No results found.  MAU Management/MDM: Ordered labs and reviewed results.  Consult Dr Billy Coastaavon.  With stable hgb and benign exam, plan to discharge pt with outpatient f/u with Dr Juliene PinaMody.  Pt to call office on Monday.  Pt stable at time of discharge.  ASSESSMENT 1. Abnormal uterine bleeding (AUB)     PLAN Discharge home with bleeding precautions    Medication List    TAKE these medications        docusate sodium 100 MG capsule  Commonly known as:  COLACE  Take 100 mg by mouth 2 (two) times daily.     IRON PO  Take 2 tablets by mouth daily.     MAGNESIA PO  Take 2 tablets by mouth daily.     montelukast 10 MG tablet  Commonly known as:  SINGULAIR  Take 10 mg by mouth at bedtime.           Follow-up Information    Schedule an appointment as soon as possible for a visit with Robley FriesMODY,VAISHALI R, MD.   Specialty:  Obstetrics and Gynecology   Why:  Return to MAU as needed for  emergencies   Contact information:   [facility] [address] [place] [id number] [phone]       Sharen CounterLisa Leftwich-Kirby Certified Nurse-Midwife 03/14/2016  7:08 PM

## 2016-04-07 ENCOUNTER — Other Ambulatory Visit: Payer: Self-pay | Admitting: Obstetrics & Gynecology

## 2016-04-08 ENCOUNTER — Encounter (HOSPITAL_COMMUNITY): Payer: Self-pay | Admitting: *Deleted

## 2016-04-13 MED ORDER — DEXTROSE 5 % IV SOLN
100.0000 mg | INTRAVENOUS | Status: AC
  Administered 2016-04-14: 100 mg via INTRAVENOUS
  Filled 2016-04-13: qty 100

## 2016-04-14 ENCOUNTER — Encounter (HOSPITAL_COMMUNITY)
Admission: RE | Disposition: A | Discharge status: Home / Self Care | Payer: Self-pay | Source: Ambulatory Visit | Attending: Obstetrics & Gynecology

## 2016-04-14 ENCOUNTER — Encounter (HOSPITAL_COMMUNITY): Payer: Self-pay

## 2016-04-14 ENCOUNTER — Ambulatory Visit (HOSPITAL_COMMUNITY)
Admission: RE | Admit: 2016-04-14 | Discharge: 2016-04-14 | Disposition: A | Discharge status: Home / Self Care | Payer: BLUE CROSS/BLUE SHIELD | Source: Ambulatory Visit | Attending: Obstetrics & Gynecology | Admitting: Obstetrics & Gynecology

## 2016-04-14 ENCOUNTER — Ambulatory Visit (HOSPITAL_COMMUNITY): Payer: BLUE CROSS/BLUE SHIELD | Admitting: Certified Registered Nurse Anesthetist

## 2016-04-14 DIAGNOSIS — K219 Gastro-esophageal reflux disease without esophagitis: Secondary | ICD-10-CM | POA: Diagnosis not present

## 2016-04-14 DIAGNOSIS — N920 Excessive and frequent menstruation with regular cycle: Secondary | ICD-10-CM | POA: Insufficient documentation

## 2016-04-14 HISTORY — DX: Pneumonia, unspecified organism: J18.9

## 2016-04-14 HISTORY — PX: DILITATION & CURRETTAGE/HYSTROSCOPY WITH NOVASURE ABLATION: SHX5568

## 2016-04-14 LAB — CBC
HEMATOCRIT: 35.7 % — AB (ref 36.0–46.0)
HEMOGLOBIN: 12.1 g/dL (ref 12.0–15.0)
MCH: 30.6 pg (ref 26.0–34.0)
MCHC: 33.9 g/dL (ref 30.0–36.0)
MCV: 90.2 fL (ref 78.0–100.0)
Platelets: 215 10*3/uL (ref 150–400)
RBC: 3.96 MIL/uL (ref 3.87–5.11)
RDW: 13.4 % (ref 11.5–15.5)
WBC: 4.7 10*3/uL (ref 4.0–10.5)

## 2016-04-14 SURGERY — DILATATION & CURETTAGE/HYSTEROSCOPY WITH NOVASURE ABLATION
Anesthesia: General

## 2016-04-14 MED ORDER — OXYCODONE-ACETAMINOPHEN 5-325 MG PO TABS: 1.0000 | ORAL_TABLET | Freq: Four times a day (QID) | ORAL | 0 refills | Status: DC | PRN

## 2016-04-14 MED ORDER — PROPOFOL 10 MG/ML IV BOLUS
INTRAVENOUS | Status: AC
  Filled 2016-04-14: qty 20

## 2016-04-14 MED ORDER — ONDANSETRON HCL 4 MG/2ML IJ SOLN
INTRAMUSCULAR | Status: DC | PRN
  Administered 2016-04-14: 4 mg via INTRAVENOUS

## 2016-04-14 MED ORDER — PROPOFOL 10 MG/ML IV BOLUS
INTRAVENOUS | Status: DC | PRN
  Administered 2016-04-14: 170 mg via INTRAVENOUS

## 2016-04-14 MED ORDER — FENTANYL CITRATE (PF) 100 MCG/2ML IJ SOLN
INTRAMUSCULAR | Status: DC | PRN
  Administered 2016-04-14: 50 ug via INTRAVENOUS
  Administered 2016-04-14: 25 ug via INTRAVENOUS

## 2016-04-14 MED ORDER — LIDOCAINE HCL 1 % IJ SOLN
INTRAMUSCULAR | Status: AC
  Filled 2016-04-14: qty 20

## 2016-04-14 MED ORDER — MIDAZOLAM HCL 2 MG/2ML IJ SOLN
INTRAMUSCULAR | Status: DC | PRN
  Administered 2016-04-14 (×2): 1 mg via INTRAVENOUS

## 2016-04-14 MED ORDER — FENTANYL CITRATE (PF) 100 MCG/2ML IJ SOLN
INTRAMUSCULAR | Status: AC
  Filled 2016-04-14: qty 2

## 2016-04-14 MED ORDER — DEXAMETHASONE SODIUM PHOSPHATE 4 MG/ML IJ SOLN
INTRAMUSCULAR | Status: AC
  Filled 2016-04-14: qty 1

## 2016-04-14 MED ORDER — LIDOCAINE HCL (CARDIAC) 20 MG/ML IV SOLN
INTRAVENOUS | Status: AC
Start: 2016-04-14 — End: 2016-04-14
  Filled 2016-04-14: qty 5

## 2016-04-14 MED ORDER — KETOROLAC TROMETHAMINE 30 MG/ML IJ SOLN
INTRAMUSCULAR | Status: AC
  Filled 2016-04-14: qty 1

## 2016-04-14 MED ORDER — SCOPOLAMINE 1 MG/3DAYS TD PT72
MEDICATED_PATCH | TRANSDERMAL | Status: AC
  Administered 2016-04-14: 1.5 mg via TRANSDERMAL
  Filled 2016-04-14: qty 1

## 2016-04-14 MED ORDER — ONDANSETRON HCL 4 MG/2ML IJ SOLN
INTRAMUSCULAR | Status: AC
  Filled 2016-04-14: qty 2

## 2016-04-14 MED ORDER — MIDAZOLAM HCL 2 MG/2ML IJ SOLN
INTRAMUSCULAR | Status: AC
  Filled 2016-04-14: qty 2

## 2016-04-14 MED ORDER — LACTATED RINGERS IV SOLN
INTRAVENOUS | Status: DC
  Administered 2016-04-14: 13:00:00 via INTRAVENOUS

## 2016-04-14 MED ORDER — LIDOCAINE HCL 1 % IJ SOLN
INTRAMUSCULAR | Status: DC | PRN
  Administered 2016-04-14: 20 mL

## 2016-04-14 MED ORDER — SODIUM CHLORIDE 0.9 % IR SOLN
Status: DC | PRN
  Administered 2016-04-14: 3000 mL

## 2016-04-14 MED ORDER — SCOPOLAMINE 1 MG/3DAYS TD PT72
1.0000 | MEDICATED_PATCH | Freq: Once | TRANSDERMAL | Status: DC
  Administered 2016-04-14: 1.5 mg via TRANSDERMAL

## 2016-04-14 MED ORDER — IBUPROFEN 600 MG PO TABS: 600.0000 mg | ORAL_TABLET | Freq: Four times a day (QID) | ORAL | 0 refills | Status: DC | PRN

## 2016-04-14 MED ORDER — LIDOCAINE HCL (CARDIAC) 20 MG/ML IV SOLN
INTRAVENOUS | Status: DC | PRN
  Administered 2016-04-14: 50 mg via INTRAVENOUS

## 2016-04-14 MED ORDER — DEXAMETHASONE SODIUM PHOSPHATE 4 MG/ML IJ SOLN
INTRAMUSCULAR | Status: DC | PRN
  Administered 2016-04-14: 4 mg via INTRAVENOUS

## 2016-04-14 SURGICAL SUPPLY — 17 items
ABLATOR ENDOMETRIAL BIPOLAR (ABLATOR) ×2 IMPLANT
BIPOLAR CUTTING LOOP 21FR (ELECTRODE)
CANISTER SUCT 3000ML (MISCELLANEOUS) ×2 IMPLANT
CATH ROBINSON RED A/P 16FR (CATHETERS) ×2 IMPLANT
CLOTH BEACON ORANGE TIMEOUT ST (SAFETY) ×2 IMPLANT
CONTAINER PREFILL 10% NBF 60ML (FORM) ×4 IMPLANT
GLOVE BIO SURGEON STRL SZ7 (GLOVE) ×2 IMPLANT
GLOVE BIOGEL PI IND STRL 7.0 (GLOVE) ×2 IMPLANT
GLOVE BIOGEL PI INDICATOR 7.0 (GLOVE) ×2
GOWN STRL REUS W/TWL LRG LVL3 (GOWN DISPOSABLE) ×6 IMPLANT
LOOP CUTTING BIPOLAR 21FR (ELECTRODE) IMPLANT
PACK VAGINAL MINOR WOMEN LF (CUSTOM PROCEDURE TRAY) ×2 IMPLANT
PAD OB MATERNITY 4.3X12.25 (PERSONAL CARE ITEMS) ×2 IMPLANT
TOWEL OR 17X24 6PK STRL BLUE (TOWEL DISPOSABLE) ×4 IMPLANT
TUBING AQUILEX INFLOW (TUBING) ×2 IMPLANT
TUBING AQUILEX OUTFLOW (TUBING) ×2 IMPLANT
WATER STERILE IRR 1000ML POUR (IV SOLUTION) ×2 IMPLANT

## 2016-04-14 NOTE — Discharge Instructions (Addendum)
DISCHARGE INSTRUCTIONS: HYSTEROSCOPY / ENDOMETRIAL ABLATION The following instructions have been prepared to help you care for yourself upon your return home.  May Remove Scop patch on or before: Friday July 28th   May take Ibuprofen after: 8:00 pm  May take stool softner while taking narcotic pain medication to prevent constipation.  Drink plenty of water.  Personal hygiene:  Use sanitary pads for vaginal drainage, not tampons.  Shower the day after your procedure.  NO tub baths, pools or Jacuzzis for 2-3 weeks.  Wipe front to back after using the bathroom.  Activity and limitations:  Do NOT drive or operate any equipment for 24 hours. The effects of anesthesia are still present and drowsiness may result.  Do NOT rest in bed all day.  Walking is encouraged.  Walk up and down stairs slowly.  You may resume your normal activity in one to two days or as indicated by your physician. Sexual activity: NO intercourse for at least 2 weeks after the procedure, or as indicated by your Doctor.  Diet: Eat a light meal as desired this evening. You may resume your usual diet tomorrow.  Return to Work: You may resume your work activities in one to two days or as indicated by Therapist, sports.  What to expect after your surgery: Expect to have vaginal bleeding/discharge for 2-3 days and spotting for up to 10 days. It is not unusual to have soreness for up to 1-2 weeks. You may have a slight burning sensation when you urinate for the first day. Mild cramps may continue for a couple of days. You may have a regular period in 2-6 weeks.  Call your doctor for any of the following:  Excessive vaginal bleeding or clotting, saturating and changing one pad every hour.  Inability to urinate 6 hours after discharge from hospital.  Pain not relieved by pain medication.  Fever of 100.4 F or greater.  Unusual vaginal discharge or odor.  Return to office _________________Call for an appointment  ___________________ Patients signature: ______________________ Nurses signature ________________________  Post Anesthesia Care Unit [id number] Post Anesthesia Home Care Instructions  Activity: Get plenty of rest for the remainder of the day. A responsible adult should stay with you for 24 hours following the procedure.  For the next 24 hours, DO NOT: -Drive a car -Advertising copywriter -Drink alcoholic beverages -Take any medication unless instructed by your physician -Make any legal decisions or sign important papers.  Meals: Start with liquid foods such as gelatin or soup. Progress to regular foods as tolerated. Avoid greasy, spicy, heavy foods. If nausea and/or vomiting occur, drink only clear liquids until the nausea and/or vomiting subsides. Call your physician if vomiting continues.  Special Instructions/Symptoms: Your throat may feel dry or sore from the anesthesia or the breathing tube placed in your throat during surgery. If this causes discomfort, gargle with warm salt water. The discomfort should disappear within 24 hours.  If you had a scopolamine patch placed behind your ear for the management of post- operative nausea and/or vomiting:  1. The medication in the patch is effective for 72 hours, after which it should be removed.  Wrap patch in a tissue and discard in the trash. Wash hands thoroughly with soap and water. 2. You may remove the patch earlier than 72 hours if you experience unpleasant side effects which may include dry mouth, dizziness or visual disturbances. 3. Avoid touching the patch. Wash your hands with soap and water after contact with the patch.  Endometrial Ablation Endometrial ablation removes the lining of the uterus (endometrium). It is usually a same-day, outpatient treatment. Ablation helps avoid major surgery, such as surgery to remove the cervix and uterus (hysterectomy). After endometrial ablation, you will have little or no menstrual bleeding and  may not be able to have children. However, if you are premenopausal, you will need to use a reliable method of birth control following the procedure because of the small chance that pregnancy can occur. There are different reasons to have this procedure. These reasons include:  Heavy periods.  Bleeding that is causing anemia.  Irregular bleeding.  Bleeding fibroids on the lining inside the uterus if they are smaller than 3 centimeters. This procedure may not be possible for you if:   You want to have children in the future.   You have severe cramps with your menstrual period.   You have precancerous or cancerous cells in your uterus.   You were recently pregnant.   You have gone through menopause.   You have had major surgery on your uterus, resulting in thinning of the uterine wall. Surgeries may include:  The removal of one or more uterine fibroids (myomectomy).  A cesarean section with a classic (vertical) incision on your uterus. Ask your health care provider what type of cesarean you had. Sometimes the scar on your skin is different than the scar on your uterus. Even if you have had surgery on your uterus, certain types of ablation may still be safe for you. Talk with your health care provider. LET [facility] CARE PROVIDER KNOW ABOUT:  Any allergies you have.  All medicines you are taking, including vitamins, herbs, eye drops, creams, and over-the-counter medicines.  Previous problems you or members of your family have had with the use of anesthetics.  Any blood disorders you have.  Previous surgeries you have had.  Medical conditions you have. RISKS AND COMPLICATIONS  Generally, this is a safe procedure. However, as with any procedure, complications can occur. Possible complications include:  Perforation of the uterus.  Bleeding.  Infection of the uterus, bladder, or vagina.  Injury to surrounding organs.  An air bubble to the lung (air  embolus).  Pregnancy following the procedure.  Failure of the procedure to help the problem, requiring hysterectomy.  Decreased ability to diagnose cancer in the lining of the uterus. BEFORE THE PROCEDURE  The lining of the uterus must be tested to make sure there is no pre-cancerous or cancer cells present.  An ultrasound may be performed to look at the size of the uterus and to check for abnormalities.  Medicines may be given to thin the lining of the uterus. PROCEDURE  During the procedure, your health care provider will use a tool called a resectoscope to help see inside your uterus. There are different ways to remove the lining of your uterus.   Radiofrequency - This method uses a radiofrequency-alternating electric current to remove the lining of the uterus.  Cryotherapy - This method uses extreme cold to freeze the lining of the uterus.  Heated-Free Liquid - This method uses heated salt (saline) solution to remove the lining of the uterus.  Microwave - This method uses high-energy microwaves to heat up the lining of the uterus to remove it.  Thermal balloon - This method involves inserting a catheter with a balloon tip into the uterus. The balloon tip is filled with heated fluid to remove the lining of the uterus. AFTER THE PROCEDURE  After your procedure,  do not have sexual intercourse or insert anything into your vagina until permitted by your health care provider. After the procedure, you may experience:  Cramps.  Vaginal discharge.  Frequent urination.   This information is not intended to replace advice given to you by your health care provider. Make sure you discuss any questions you have with your health care provider.   Document Released: 07/17/2004 Document Revised: 05/29/2015 Document Reviewed: 02/08/2013 Elsevier Interactive Patient Education [employer].

## 2016-04-14 NOTE — H&P (Signed)
Robin Goodwin is an 55 y.o. female with menorrhagia. Perimenopausal with abnormal menses, menorrhagia, office endometrial biopsy benign. Patient gets very heavy flow and gets anemic.   Normal Paps, normal mammograms. Reviewed ablation v/s hysterectomy options with risks/ benefits and after careful discussion, we are proceeding with endometrial ablation.   Patient's last menstrual period was 03/12/2016 (exact date).    Past Medical History:  Diagnosis Date  . Anemia   . Endometrial polyp 12/01/2013  . GERD (gastroesophageal reflux disease)   . Pneumonia 05/2015  . Vaginal delivery 1989, 1992, 1995    Past Surgical History:  Procedure Laterality Date  . COLONOSCOPY    . DILATATION & CURETTAGE/HYSTEROSCOPY WITH TRUECLEAR N/A 12/01/2013   Procedure: DILATATION & CURETTAGE/HYSTEROSCOPY WITH TRUCLEAR;  Surgeon: Robley Fries, MD;  Location: [facility];  Service: Gynecology;  Laterality: N/A;  . UPPER GI ENDOSCOPY      History reviewed. No pertinent family history.  Social History:  reports that she has never smoked. She has never used smokeless tobacco. She reports that she does not drink alcohol or use drugs.  Allergies:  Allergies  Allergen Reactions  . Duricef [Cefadroxil] Other (See Comments)    Blood in stool  . Progesterone Hives  . Sulfa Antibiotics Other (See Comments)    Eye pain, visual changes    Prescriptions Prior to Admission  Medication Sig Dispense Refill Last Dose  . dexlansoprazole (DEXILANT) 60 MG capsule Take 60 mg by mouth daily.   04/13/2016 at Unknown time  . docusate sodium (COLACE) 100 MG capsule Take 300 mg by mouth at bedtime as needed for mild constipation.    Past Week at Unknown time  . ibuprofen (ADVIL,MOTRIN) 200 MG tablet Take 600 mg by mouth every 6 (six) hours as needed for mild pain.   Past Month at Unknown time  . IRON PO Take 2 tablets by mouth daily.   04/13/2016 at Unknown time  . Magnesium Hydroxide (MAGNESIA PO) Take 2 tablets by mouth daily.    04/13/2016 at Unknown time  . montelukast (SINGULAIR) 10 MG tablet Take 10 mg by mouth at bedtime.   Past Week at Unknown time    ROS neg   Blood pressure 129/84, pulse (!) 59, temperature 97.9 F (36.6 C), temperature source Oral, resp. rate 16, height  (1.626 m), weight 125 lb (56.7 kg), last menstrual period 03/12/2016, SpO2 100 %. Physical Exam   A&O x 3, no acute distress. Pleasant HEENT neg, no thyromegaly Lungs CTA bilat CV RRR, S1S2 normal Abdo soft, non tender, non acute Extr no edema/ tenderness Pelvic Uterus slightly enlarged but normal, normal ovaries.   Results for orders placed or performed during the hospital encounter of 04/14/16 (from the past 24 hour(s))  CBC     Status: Abnormal   Collection Time: 04/14/16 12:10 PM  Result Value Ref Range   WBC 4.7 4.0 - 10.5 K/uL   RBC 3.96 3.87 - 5.11 MIL/uL   Hemoglobin 12.1 12.0 - 15.0 g/dL   HCT 16.1 (L) 09.6 - 04.5 %   MCV 90.2 78.0 - 100.0 fL   MCH 30.6 26.0 - 34.0 pg   MCHC 33.9 30.0 - 36.0 g/dL   RDW 40.9 81.1 - 91.4 %   Platelets 215 150 - 400 K/uL    No results found.  Assessment/Plan: 55 yo female with menorrhagia. Not menopausal. Cannot tolerate hormones. So here for hysteroscopy, D&C and endometrial ablation.  Risks/complications of surgery reviewed incl infection, bleeding, damage to  internal organs including bladder, bowels, ureters, blood vessels, other risks from anesthesia, VTE and delayed complications of any surgery, complications in future surgery reviewed.   [name] R 04/14/2016, 12:57 PM

## 2016-04-14 NOTE — Transfer of Care (Signed)
Immediate Anesthesia Transfer of Care Note  Patient: Robin Goodwin  Procedure(s) Performed: Procedure(s): DILATATION & CURETTAGE/HYSTEROSCOPY/ WITH NOVASURE ABLATION (N/A)  Patient Location: PACU  Anesthesia Type:General  Level of Consciousness: awake  Airway & Oxygen Therapy: Patient Spontanous Breathing and Patient connected to nasal cannula oxygen  Post-op Assessment: Report given to RN and Post -op Vital signs reviewed and stable  Post vital signs: Reviewed and stable  Last Vitals:  Vitals:   04/14/16 1216  BP: 129/84  Pulse: (!) 59  Resp: 16  Temp: 36.6 C    Last Pain:  Vitals:   04/14/16 1216  TempSrc: Oral      Patients Stated Pain Goal: 3 (04/14/16 1216)  Complications: No apparent anesthesia complications

## 2016-04-14 NOTE — Anesthesia Preprocedure Evaluation (Addendum)
Anesthesia Evaluation  Patient identified by MRN, date of birth, ID band Patient awake    Reviewed: Allergy & Precautions, NPO status , Patient's Chart, lab work & pertinent test results  History of Anesthesia Complications Negative for: history of anesthetic complications  Airway Mallampati: I  TM Distance: >3 FB Neck ROM: Full    Dental  (+) Teeth Intact, Dental Advisory Given   Pulmonary neg pulmonary ROS,    Pulmonary exam normal breath sounds clear to auscultation       Cardiovascular Exercise Tolerance: Good negative cardio ROS Normal cardiovascular exam Rhythm:Regular Rate:Normal     Neuro/Psych negative neurological ROS  negative psych ROS   GI/Hepatic Neg liver ROS, GERD  Medicated and Controlled,  Endo/Other  negative endocrine ROS  Renal/GU negative Renal ROS     Musculoskeletal negative musculoskeletal ROS (+)   Abdominal   Peds  Hematology  (+) Blood dyscrasia, anemia ,   Anesthesia Other Findings Day of surgery medications reviewed with the patient.  Reproductive/Obstetrics Endometrial polyp                            Anesthesia Physical Anesthesia Plan  ASA: II  Anesthesia Plan: General   Post-op Pain Management:    Induction: Intravenous  Airway Management Planned: LMA  Additional Equipment:   Intra-op Plan:   Post-operative Plan: Extubation in OR  Informed Consent: I have reviewed the patients History and Physical, chart, labs and discussed the procedure including the risks, benefits and alternatives for the proposed anesthesia with the patient or authorized representative who has indicated his/her understanding and acceptance.   Dental advisory given  Plan Discussed with: CRNA  Anesthesia Plan Comments: (Risks/benefits of general anesthesia discussed with patient including risk of damage to teeth, lips, gum, and tongue, nausea/vomiting, allergic  reactions to medications, and the possibility of heart attack, stroke and death.  All patient questions answered.  Patient wishes to proceed.)       Anesthesia Quick Evaluation

## 2016-04-14 NOTE — Op Note (Signed)
Preoperative diagnosis: Menorrhagia  Postop diagnosis: as above.  Procedure: Diagnostic Hysteroscopy, D&C and Novasure endometrial ablation Anesthesia General via LMA  Surgeon: Shea Evans, MD  Assistant: None IV fluids : LR 600 cc  Estimated blood loss : 5 cc  Urine output: straight catheter preop 50 cc   Complications none  Condition stable  Disposition PACU  Specimen: Endometrial curettings  Saline irrigation fluid deficit: 60 cc  Procedure  Indication: Menorrhagia. Office sono noted slightly enlarged but normal uterus and normal ovaries. Endometrial biopsy normal. Patient was counseled on options and risks/ complications including infection, bleeding, damage to internal organs, she understood and agrees, gave informed written consent for Hysteroscopy, D&C and Novasure endometrial ablation.   Patient was brought to the operating room with IV running. Time out was carried out. She received preop Doxycycline 100mg . She underwent general anesthesia via LMA without complications. She was given dorsolithotomy position. Parts were prepped and draped in standard fashion. Bladder was catheterized once. Bimanual exam revealed uterus to be anteverted and normal size. Speculum was placed and cervix was grasped with single-tooth tenaculum. Cervical block with 20 cc 1% plain Xylocaine given. The uterus was sounded to 9 cm. Cervical length 4.5 cm, cavity width 4 cm.  Cervical os was dilated to 21 [place] dilator. Hysteroscope was introduced in the uterine cavity under vision, using Saline for irrigation. Findings: normal endometrial cavity with tubal ostii and no polyps/ mass. Hysteroscope removed and curettage performed and specimen sent to path.  Novasure device inserted and measurements set on the machine. Power 99. Global endometrial ablation done, total 1 min and 47 seconds. Device was removed after the machine signalled end of the procedure.  Hysteroscopy reintroduced and adequate cavity burn was  noted. All instruments removed. No active bleeding noted.   Fluid deficit 60 cc.   All counts are correct x2. No complications. Patient was brought to the recovery room in stable condition.  Patient will be discharged home today. Follow up in 2 weeks in office. Warning signs of infection and excessive bleeding reviewed.   V.Miho Monda, MD.

## 2016-04-14 NOTE — Anesthesia Procedure Notes (Signed)
Procedure Name: LMA Insertion Date/Time: 04/14/2016 1:33 PM Performed by: Cleda Clarks Pre-anesthesia Checklist: Patient identified, Emergency Drugs available, Suction available, Patient being monitored and Timeout performed Patient Re-evaluated:Patient Re-evaluated prior to inductionOxygen Delivery Method: Circle system utilized Preoxygenation: Pre-oxygenation with 100% oxygen Intubation Type: IV induction Ventilation: Mask ventilation without difficulty LMA: LMA inserted LMA Size: 4.0 Grade View: Grade I Number of attempts: 1 Placement Confirmation: ETT inserted through vocal cords under direct vision,  positive ETCO2 and breath sounds checked- equal and bilateral Tube secured with: Tape Dental Injury: Teeth and Oropharynx as per pre-operative assessment

## 2016-04-14 NOTE — Anesthesia Postprocedure Evaluation (Signed)
Anesthesia Post Note  Patient: Robin Goodwin  Procedure(s) Performed: Procedure(s) (LRB): DILATATION & CURETTAGE/HYSTEROSCOPY/ WITH NOVASURE ABLATION (N/A)  Patient location during evaluation: PACU Anesthesia Type: General Level of consciousness: awake Pain management: pain level controlled Vital Signs Assessment: post-procedure vital signs reviewed and stable Respiratory status: spontaneous breathing Cardiovascular status: stable Postop Assessment: no signs of nausea or vomiting Anesthetic complications: no     Last Vitals:  Vitals:   04/14/16 1500 04/14/16 1515  BP: 117/66 135/80  Pulse: (!) 53 (!) 58  Resp: 10 12  Temp:  36.7 C    Last Pain:  Vitals:   04/14/16 1515  TempSrc:   PainSc: 3    Pain Goal: Patients Stated Pain Goal: 3 (04/14/16 1515)               Arinze Rivadeneira JR,JOHN Susann Givens

## 2016-04-20 ENCOUNTER — Encounter (HOSPITAL_COMMUNITY): Payer: Self-pay | Admitting: Obstetrics & Gynecology

## 2021-08-21 ENCOUNTER — Encounter: Payer: Self-pay | Attending: Family Medicine | Admitting: Dietician

## 2021-08-21 ENCOUNTER — Encounter: Payer: Self-pay | Admitting: Dietician

## 2021-08-21 ENCOUNTER — Other Ambulatory Visit: Payer: Self-pay

## 2021-08-21 VITALS — Ht 64.0 in | Wt 130.2 lb

## 2021-08-21 DIAGNOSIS — R7303 Prediabetes: Secondary | ICD-10-CM | POA: Insufficient documentation

## 2021-08-21 NOTE — Progress Notes (Signed)
Medical Nutrition Therapy: Visit start time: 1340  end time: 1440  Assessment:  Diagnosis: pre-diabetes Past medical history: anxiety Psychosocial issues/ stress concerns: anxiety  Preferred learning method:  Auditory- discussion Visual Hands-on  Current weight: 130.2 with shoes, sweater Height: 5'4" BMI: 22.35 Medications, supplements: reconciled list in medical record  Progress and evaluation:  Patient reports  HbA1C has been in pre-diabetes range for the past 2-3 years. Most recent HbA1C (05/2021) was 6.1% She has worked on diet and lifestyle changes periodically, but loves some starches such as rice and pasta. She reports increased desire for sweets since being diagnosed with pre-diabetes. Was walking 5-7 days per week after onset of COVID pandemic, which helped improve BG control. Now walking 3-4 times weekly. Father has Type 2 Diabetes. Patient's goal is to prevent diabetes, avoid need for medication.  Physical activity: walking 3-4 miles, 3-4 days per week  Dietary Intake:  Usual eating pattern includes 2-3 meals and 1-2 snacks per day. Dining out frequency: 4 meals per week.  Breakfast: was -- cornflakes with banana and blueberries; now-- banana, hard boiled egg Snack: none Lunch: 1/2 ham or peanut butter sandwich on  low carb tortilla, orange, occ sm bag chips Snack: blueberries and almonds; cheese and pepperoni, wheat thins Supper: usu more hungry -- salad with grilled chicken; pasta/ rice limits portions; soup; limits bread; some restaurant meals -- tries to make lower carb, low fat choices Snack: occ popcorn; chick fila cookie; nuts berries; saltines and peanut butter Beverages: water; no sodas; no alcohol; used to drink milk at night with cookie/ graham crackers  Nutrition Care Education: Topics covered:  Basic nutrition: basic food groups, appropriate nutrient balance, appropriate meal and snack schedule, general nutrition guidelines    Diabetes prevention:   appropriate meal and snack schedule; appropriate carb intake and balance, healthy carb choices, role of fiber, protein, fat   Nutritional Diagnosis:  [place]-2.2 Altered nutrition-related laboratory As related to pre-diabetes.  As evidenced by recent HbA1C of 6.1%.  Intervention:  Instruction and discussion as noted above. Patient has been working on diet changes to improve BG control and prevent diabetes. She is motivated to continue. Established additional goals for change with direction from patient.  No follow up needed at this time; patient to schedule later if needed.  Education Materials given:  General meal planning guidelines for Diabetes (prevention) Plate Planner with food lists, sample meal pattern Sample menus Visit summary with goals/ instructions   Learner/ who was taught:  Patient    Level of understanding: Verbalizes/ demonstrates competency   Demonstrated degree of understanding via:   Teach back Learning barriers: None  Willingness to learn/ readiness for change: Eager, change in progress  Monitoring and Evaluation:  Dietary intake, exercise, BG control, and body weight      follow up: prn

## 2021-08-21 NOTE — Patient Instructions (Addendum)
Make sure to eat enough at breakfast to avoid low blood sugar symptoms and help control evening hunger.  Portions of protein foods (lean) and low carb veggies are flexible.  Choose mostly whole grain, high fiber grains, whole fruits and vegetables, beans, peas as lower glycemic carb choices.  Continue with regular exercise, great job!

## 2022-04-13 ENCOUNTER — Other Ambulatory Visit: Payer: Self-pay | Admitting: Internal Medicine

## 2022-04-13 ENCOUNTER — Ambulatory Visit
Admission: RE | Admit: 2022-04-13 | Discharge: 2022-04-13 | Disposition: A | Discharge status: Home / Self Care | Payer: Self-pay | Source: Ambulatory Visit | Attending: Internal Medicine | Admitting: Internal Medicine

## 2022-04-13 DIAGNOSIS — N23 Unspecified renal colic: Secondary | ICD-10-CM

## 2022-04-13 DIAGNOSIS — N200 Calculus of kidney: Secondary | ICD-10-CM

## 2024-04-27 ENCOUNTER — Ambulatory Visit: Payer: PRIVATE HEALTH INSURANCE

## 2024-04-27 DIAGNOSIS — K641 Second degree hemorrhoids: Secondary | ICD-10-CM | POA: Diagnosis not present

## 2024-04-27 DIAGNOSIS — Z1211 Encounter for screening for malignant neoplasm of colon: Secondary | ICD-10-CM | POA: Diagnosis present
